# Patient Record
Sex: Female | Born: 1985 | Hispanic: Yes | Marital: Single | Smoking: Never smoker
Health system: Southern US, Community
[De-identification: ages and names within clinical notes are randomized; demographics above are authoritative.]

## PROBLEM LIST (undated history)

## (undated) ENCOUNTER — Inpatient Hospital Stay: Payer: Self-pay

## (undated) DIAGNOSIS — Z789 Other specified health status: Secondary | ICD-10-CM

## (undated) DIAGNOSIS — I10 Essential (primary) hypertension: Secondary | ICD-10-CM

---

## 2005-05-28 ENCOUNTER — Ambulatory Visit: Payer: Self-pay | Admitting: Family Medicine

## 2005-07-11 ENCOUNTER — Ambulatory Visit: Payer: Self-pay | Admitting: Family Medicine

## 2005-08-19 ENCOUNTER — Inpatient Hospital Stay: Payer: Self-pay

## 2005-09-21 ENCOUNTER — Observation Stay: Payer: Self-pay

## 2005-11-15 ENCOUNTER — Inpatient Hospital Stay: Payer: Self-pay | Admitting: Obstetrics and Gynecology

## 2012-10-24 ENCOUNTER — Emergency Department: Payer: Self-pay | Admitting: Emergency Medicine

## 2015-11-18 LAB — HM PAP SMEAR: HM Pap smear: NEGATIVE

## 2016-05-01 LAB — OB RESULTS CONSOLE VARICELLA ZOSTER ANTIBODY, IGG: VARICELLA IGG: IMMUNE

## 2016-05-01 LAB — OB RESULTS CONSOLE RUBELLA ANTIBODY, IGM: RUBELLA: IMMUNE

## 2016-05-01 LAB — OB RESULTS CONSOLE HIV ANTIBODY (ROUTINE TESTING): HIV: NONREACTIVE

## 2016-05-01 LAB — OB RESULTS CONSOLE RPR: RPR: NONREACTIVE

## 2016-05-02 LAB — OB RESULTS CONSOLE HEPATITIS B SURFACE ANTIGEN: Hepatitis B Surface Ag: NEGATIVE

## 2016-05-30 ENCOUNTER — Other Ambulatory Visit: Payer: Self-pay | Admitting: Family Medicine

## 2016-05-30 DIAGNOSIS — Z3482 Encounter for supervision of other normal pregnancy, second trimester: Secondary | ICD-10-CM

## 2016-06-06 ENCOUNTER — Ambulatory Visit
Admission: RE | Admit: 2016-06-06 | Discharge: 2016-06-06 | Disposition: A | Discharge status: Home / Self Care | Payer: Medicaid Other | Source: Ambulatory Visit | Attending: Family Medicine | Admitting: Family Medicine

## 2016-06-06 DIAGNOSIS — Z3A17 17 weeks gestation of pregnancy: Secondary | ICD-10-CM | POA: Insufficient documentation

## 2016-06-06 DIAGNOSIS — Z3482 Encounter for supervision of other normal pregnancy, second trimester: Secondary | ICD-10-CM | POA: Diagnosis not present

## 2016-07-03 ENCOUNTER — Other Ambulatory Visit: Payer: Self-pay | Admitting: Family Medicine

## 2016-07-03 DIAGNOSIS — Z3689 Encounter for other specified antenatal screening: Secondary | ICD-10-CM

## 2016-07-19 ENCOUNTER — Encounter: Payer: Self-pay | Admitting: Radiology

## 2016-07-19 ENCOUNTER — Ambulatory Visit
Admission: RE | Admit: 2016-07-19 | Discharge: 2016-07-19 | Disposition: A | Discharge status: Home / Self Care | Payer: Self-pay | Source: Ambulatory Visit | Attending: Family Medicine | Admitting: Family Medicine

## 2016-07-19 DIAGNOSIS — Z3A24 24 weeks gestation of pregnancy: Secondary | ICD-10-CM | POA: Insufficient documentation

## 2016-07-19 DIAGNOSIS — Z3689 Encounter for other specified antenatal screening: Secondary | ICD-10-CM

## 2016-07-19 HISTORY — DX: Other specified health status: Z78.9

## 2016-10-02 LAB — OB RESULTS CONSOLE GC/CHLAMYDIA
CHLAMYDIA, DNA PROBE: NEGATIVE
Gonorrhea: NEGATIVE

## 2016-10-26 ENCOUNTER — Encounter
Admission: RE | Admit: 2016-10-26 | Discharge: 2016-10-26 | Disposition: A | Discharge status: Home / Self Care | Payer: Self-pay | Source: Ambulatory Visit | Attending: Obstetrics & Gynecology | Admitting: Obstetrics & Gynecology

## 2016-10-26 ENCOUNTER — Inpatient Hospital Stay
Admission: EM | Admit: 2016-10-26 | Discharge: 2016-10-29 | DRG: 766 | Disposition: A | Discharge status: Home / Self Care | Payer: Medicaid Other | Attending: Obstetrics and Gynecology | Admitting: Obstetrics and Gynecology

## 2016-10-26 ENCOUNTER — Observation Stay
Admission: EM | Admit: 2016-10-26 | Discharge: 2016-10-26 | Disposition: A | Discharge status: Home / Self Care | Payer: Self-pay | Attending: Obstetrics and Gynecology | Admitting: Obstetrics and Gynecology

## 2016-10-26 DIAGNOSIS — O26899 Other specified pregnancy related conditions, unspecified trimester: Principal | ICD-10-CM | POA: Insufficient documentation

## 2016-10-26 DIAGNOSIS — Z3A38 38 weeks gestation of pregnancy: Secondary | ICD-10-CM

## 2016-10-26 DIAGNOSIS — Z01812 Encounter for preprocedural laboratory examination: Secondary | ICD-10-CM | POA: Insufficient documentation

## 2016-10-26 DIAGNOSIS — O34211 Maternal care for low transverse scar from previous cesarean delivery: Principal | ICD-10-CM | POA: Diagnosis present

## 2016-10-26 DIAGNOSIS — Z9889 Other specified postprocedural states: Secondary | ICD-10-CM

## 2016-10-26 HISTORY — DX: Essential (primary) hypertension: I10

## 2016-10-26 LAB — TYPE AND SCREEN
ABO/RH(D): B POS
Antibody Screen: NEGATIVE
Extend sample reason: UNDETERMINED

## 2016-10-26 LAB — CBC
HEMATOCRIT: 41.5 % (ref 35.0–47.0)
HEMOGLOBIN: 14 g/dL (ref 12.0–16.0)
MCH: 30.3 pg (ref 26.0–34.0)
MCHC: 33.7 g/dL (ref 32.0–36.0)
MCV: 90.1 fL (ref 80.0–100.0)
Platelets: 135 10*3/uL — ABNORMAL LOW (ref 150–440)
RBC: 4.6 MIL/uL (ref 3.80–5.20)
RDW: 14.5 % (ref 11.5–14.5)
WBC: 9.7 10*3/uL (ref 3.6–11.0)

## 2016-10-26 LAB — BASIC METABOLIC PANEL
Anion gap: 7 (ref 5–15)
BUN: 9 mg/dL (ref 6–20)
CHLORIDE: 105 mmol/L (ref 101–111)
CO2: 26 mmol/L (ref 22–32)
CREATININE: 0.61 mg/dL (ref 0.44–1.00)
Calcium: 9.3 mg/dL (ref 8.9–10.3)
GFR calc Af Amer: >60 mL/min (ref >60)
GFR calc non Af Amer: >60 mL/min (ref >60)
Glucose, Bld: 76 mg/dL (ref 65–99)
POTASSIUM: 4 mmol/L (ref 3.5–5.1)
Sodium: 138 mmol/L (ref 135–145)

## 2016-10-26 LAB — RAPID HIV SCREEN (HIV 1/2 AB+AG)
HIV 1/2 ANTIBODIES: NONREACTIVE
HIV-1 P24 ANTIGEN - HIV24: NONREACTIVE

## 2016-10-26 NOTE — OB Triage Note (Signed)
Patient c/o of possible ROM. Patient was in bed when she felt gush of fluid around 0600am. Reports that she no longer felt LOF. Reports lower abdominal pain since yesterday rating contraction 7 out of 10. Reports positive FM. Denies bleeding. Nitrozen however negative. Last pregnancy resulted in c-section due to preeclampsia per patient.

## 2016-10-26 NOTE — H&P (Signed)
OB History & Physical  Chief Complaint:   Patient ID: Candace Dominguez is a 31 y.o. female who receives care at [facility], presents today to discuss Delivery plans  HPI: Limited records available at time of visit, patient is spanish speaking only with use of interpreter. Had an emergency Cesarean 19yrs ago due to what she says was preeclampsia; was put to sleep. Does not recall any complications. She is otherwise healthy and this pregnancy has had no complications. She is dated by sure LMP on 01/30/16 and confirmed with a 17wk ultrasound.  Her EDD is 11/05/16.  Problems this pregnancy 1. Dilated renal pelvis at 17wks, resolved by 23 weeks 2. History of cesarean  Past Medical History: has no past medical history on file.  Past Surgical History: has a past surgical history that includes Cesarean section. Family History: family history includes Diabetes in her father and maternal aunt. Social History: reports that she has never smoked. She has never used smokeless tobacco. She reports that she does not drink alcohol or use drugs. OB/GYN History:  OB History  Gravida Para Term Preterm AB Living  SAB TAB Ectopic Molar Multiple Live Births  2 1    Allergies: has No Known Allergies. Medications:  Current Outpatient Prescriptions:  . aspirin 81 MG chewable tablet, Take by mouth., Disp: , Rfl:  . prenatal vit-sel-iron fum-FA 27-1 mg Tab, Take by mouth., Disp: , Rfl:   Review of Systems: No SOB, no palpitations or chest pain, no new lower extremity edema, no nausea or vomiting or bowel or bladder complaints. See HPI for gyn specific ROS.  Exam:    BP 119/80  Pulse 96  Ht 154.9 cm ( )  Wt 76.6 kg (168 lb 12.8 oz)  BMI 31.89 kg/m   General: Patient is well-groomed, well-nourished, appears stated age in no acute distress  HEENT: head is atraumatic and normocephalic, trachea is midline, neck is supple with no palpable nodules  CV:  Regular rhythm and normal heart rate, no murmur  Pulm: Clear to auscultation throughout lung fields with no wheezing, crackles, or rhonchi. No increased work of breathing  Abdomen: soft , no mass, non-tender, no rebound tenderness, no hepatomegaly, well-healed transverse pfannenstiel scar  Pelvic: deferred  Impression:   The encounter diagnosis was Preoperative exam for gynecologic surgery.  Plan:   Will plan for repeat cesarean at 39wks, or sooner if labor presents. ON books for 10/29/16 - notified labor and delivery, as well as OR Will need to return to sign consents and for preop testing.  ----- Ranae Plumber, MD Attending Obstetrician and Gynecologist [facility], Department of OB/GYN [facility]

## 2016-10-26 NOTE — Final Progress Note (Signed)
NST  Baseline:  Variability: moderate Accelerations present x >2 Decelerations absent Time  Interpretation: reactive NST, category 1 tracing  ----- Christeen Douglas, MD MPH Attending Obstetrician and Gynecologist [facility], Department of OB/GYN [facility]  NST  Baseline: 140 Variability: moderate Accelerations present x >2 Decelerations absent Time  Interpretation: reactive NST, category 1 tracing  ----- Christeen Douglas, MD MPH Attending Obstetrician and Gynecologist [facility], Department of OB/GYN [facility]

## 2016-10-26 NOTE — OB Triage Note (Signed)
Patient c/o of LOF since this morning at 6am. However nitrozen was negative this morning and patient was discharged home. Since discharge patient c/o of leakage, however around 2230 patient felt another gush of fluid of brown color. Patient also c/o of contractions every 6 mins apart rating pain 10 out of 10 when contractions. Denies vaginal bleeding. LOF per patient is a brown tinge color. Denies sexual intercourse in the last 24hrs.

## 2016-10-26 NOTE — Patient Instructions (Signed)
Your procedure is scheduled on: @ Septembre 17, 2018 Lunes Report to departmente emegencia a     Remember: Instructions that are not followed completely may result in serious medical risk, up to and including death, or upon the discretion of your surgeon and anesthesiologist your surgery may need to be rescheduled.  Recuerde: Las instrucciones que no se siguen completamente Armed forces logistics/support/administrative officer en un riesgo de salud grave, incluyendo hasta la [place] o a discrecin de su cirujano y Scientific laboratory technician, su ciruga se puede posponer.   __x__ 1. Do not eat food or drink liquids after midnight. No gum chewing or hard candies.  No coma alimentos ni tome lquidos despus de la medianoche.  No mastique chicle ni caramelos  duros.     __x__ 2. No alcohol for 24 hours before or after surgery.    No tome alcohol durante las 24 horas antes ni despus de la [place].     __x__ 3 Notify your doctor if there is any change in your medical condition (cold, fever,                             infections).    Informe a su mdico si hay algn cambio en su condicin mdica (resfriado, fiebre, infecciones).   Do not wear jewelry, make-up, hairpins, clips or nail polish.  No use joyas, maquillajes, pinzas/ganchos para el cabello ni esmalte de uas.  Do not wear lotions, powders, or perfumes. You may wear deodorant.  No use lociones, polvos o perfumes.  Puede usar desodorante.    Do not shave 48 hours prior to surgery. Men may shave face and neck.  No se afeite 48 horas antes de la [place].  Los hombres pueden [employer] cara y el cuello.   Do not bring valuables to the hospital.   No lleve objetos de valor al hospital.  [facility] is not responsible for any belongings or valuables.  [place] no se hace responsable de ningn tipo de pertenencias u objetos de Licensed conveyancer.                Deje su maleta en el auto.  Despus de la ciruga podr traerla a su habitacin.  For patients admitted to the hospital,  discharge time is determined by your treatment team.  Para los pacientes que sean ingresados al hospital, el tiempo en el cual se le dar de alta es determinado por su                equipo de [place].   Patients discharged the day of surgery will not be allowed to drive home. A los pacientes que se les da de alta el mismo da de la ciruga no se les permitir conducir a Higher education careers adviser.   Please read over the following fact sheets that you were given: Por favor lea las siguientes hojas de informacin que le dieron:      ____ Take these medicines the morning of surgery with A SIP OF WATER:          [employer] estas medicinas la maana de la ciruga con UN SORBO DE AGUA:  1. nada  2.   3.   4.       5.  6.     __x__ Use CHG Soap as directed          Utilice el jabn de CHG segn lo indicado

## 2016-10-27 ENCOUNTER — Inpatient Hospital Stay: Payer: Medicaid Other | Admitting: Anesthesiology

## 2016-10-27 ENCOUNTER — Encounter
Admission: EM | Disposition: A | Discharge status: Home / Self Care | Payer: Self-pay | Source: Home / Self Care | Attending: Obstetrics and Gynecology

## 2016-10-27 DIAGNOSIS — Z3A38 38 weeks gestation of pregnancy: Secondary | ICD-10-CM | POA: Diagnosis not present

## 2016-10-27 DIAGNOSIS — O34211 Maternal care for low transverse scar from previous cesarean delivery: Secondary | ICD-10-CM | POA: Diagnosis present

## 2016-10-27 DIAGNOSIS — Z9889 Other specified postprocedural states: Secondary | ICD-10-CM

## 2016-10-27 LAB — CBC
HCT: 38.3 % (ref 35.0–47.0)
Hemoglobin: 13 g/dL (ref 12.0–16.0)
MCH: 30.3 pg (ref 26.0–34.0)
MCHC: 34 g/dL (ref 32.0–36.0)
MCV: 89.1 fL (ref 80.0–100.0)
PLATELETS: 122 10*3/uL — AB (ref 150–440)
RBC: 4.3 MIL/uL (ref 3.80–5.20)
RDW: 14 % (ref 11.5–14.5)
WBC: 13.3 10*3/uL — ABNORMAL HIGH (ref 3.6–11.0)

## 2016-10-27 LAB — TYPE AND SCREEN
ABO/RH(D): B POS
Antibody Screen: NEGATIVE

## 2016-10-27 LAB — RPR: RPR: NONREACTIVE

## 2016-10-27 SURGERY — Surgical Case
Anesthesia: Spinal | Wound class: Clean Contaminated

## 2016-10-27 MED ORDER — WITCH HAZEL-GLYCERIN EX PADS: 1.0000 "application " | MEDICATED_PAD | CUTANEOUS | Status: DC | PRN

## 2016-10-27 MED ORDER — MENTHOL 3 MG MT LOZG
1.0000 | LOZENGE | OROMUCOSAL | Status: DC | PRN
  Filled 2016-10-27: qty 9

## 2016-10-27 MED ORDER — TETANUS-DIPHTH-ACELL PERTUSSIS 5-2.5-18.5 LF-MCG/0.5 IM SUSP: 0.5000 mL | Freq: Once | INTRAMUSCULAR | Status: DC

## 2016-10-27 MED ORDER — BUPIVACAINE LIPOSOME 1.3 % IJ SUSP
20.0000 mL | Freq: Once | INTRAMUSCULAR | Status: DC
  Filled 2016-10-27: qty 20

## 2016-10-27 MED ORDER — CEFAZOLIN SODIUM-DEXTROSE 2-4 GM/100ML-% IV SOLN
2.0000 g | INTRAVENOUS | Status: AC
  Administered 2016-10-27: 2 g via INTRAVENOUS
  Filled 2016-10-27: qty 100

## 2016-10-27 MED ORDER — NALBUPHINE HCL 10 MG/ML IJ SOLN: 5.0000 mg | INTRAMUSCULAR | Status: DC | PRN

## 2016-10-27 MED ORDER — SODIUM CHLORIDE 0.9 % IJ SOLN
INTRAMUSCULAR | Status: AC
  Filled 2016-10-27: qty 50

## 2016-10-27 MED ORDER — PHENYLEPHRINE HCL 10 MG/ML IJ SOLN
INTRAMUSCULAR | Status: DC | PRN
  Administered 2016-10-27: 100 ug via INTRAVENOUS

## 2016-10-27 MED ORDER — NALOXONE HCL 0.4 MG/ML IJ SOLN: 0.4000 mg | INTRAMUSCULAR | Status: DC | PRN

## 2016-10-27 MED ORDER — MEPERIDINE HCL 50 MG/ML IJ SOLN: 6.2500 mg | INTRAMUSCULAR | Status: DC | PRN

## 2016-10-27 MED ORDER — DIPHENHYDRAMINE HCL 25 MG PO CAPS: 25.0000 mg | ORAL_CAPSULE | ORAL | Status: DC | PRN

## 2016-10-27 MED ORDER — OXYTOCIN 10 UNIT/ML IJ SOLN
INTRAMUSCULAR | Status: AC
  Filled 2016-10-27: qty 2

## 2016-10-27 MED ORDER — OXYTOCIN 40 UNITS IN LACTATED RINGERS INFUSION - SIMPLE MED
INTRAVENOUS | Status: DC | PRN
  Administered 2016-10-27: 1000 mL via INTRAVENOUS

## 2016-10-27 MED ORDER — ACETAMINOPHEN 325 MG PO TABS: 650.0000 mg | ORAL_TABLET | ORAL | Status: DC | PRN

## 2016-10-27 MED ORDER — SENNOSIDES-DOCUSATE SODIUM 8.6-50 MG PO TABS
2.0000 | ORAL_TABLET | ORAL | Status: DC
  Administered 2016-10-28: 2 via ORAL
  Filled 2016-10-27: qty 2

## 2016-10-27 MED ORDER — SIMETHICONE 80 MG PO CHEW
80.0000 mg | CHEWABLE_TABLET | Freq: Three times a day (TID) | ORAL | Status: DC
  Administered 2016-10-27 – 2016-10-29 (×6): 80 mg via ORAL
  Filled 2016-10-27 (×6): qty 1

## 2016-10-27 MED ORDER — OXYTOCIN 40 UNITS IN LACTATED RINGERS INFUSION - SIMPLE MED
2.5000 [IU]/h | INTRAVENOUS | Status: AC
  Administered 2016-10-27: 2.5 [IU]/h via INTRAVENOUS
  Filled 2016-10-27 (×2): qty 1000

## 2016-10-27 MED ORDER — ZOLPIDEM TARTRATE 5 MG PO TABS: 5.0000 mg | ORAL_TABLET | Freq: Every evening | ORAL | Status: DC | PRN

## 2016-10-27 MED ORDER — OXYCODONE HCL 5 MG PO TABS: 5.0000 mg | ORAL_TABLET | ORAL | Status: DC | PRN

## 2016-10-27 MED ORDER — LACTATED RINGERS IV BOLUS (SEPSIS)
1000.0000 mL | Freq: Once | INTRAVENOUS | Status: AC
  Administered 2016-10-27: 1000 mL via INTRAVENOUS

## 2016-10-27 MED ORDER — SIMETHICONE 80 MG PO CHEW: 80.0000 mg | CHEWABLE_TABLET | ORAL | Status: DC | PRN

## 2016-10-27 MED ORDER — LACTATED RINGERS IV SOLN
INTRAVENOUS | Status: DC
  Administered 2016-10-28: 07:00:00 via INTRAVENOUS

## 2016-10-27 MED ORDER — MORPHINE SULFATE (PF) 0.5 MG/ML IJ SOLN
INTRAMUSCULAR | Status: DC | PRN
  Administered 2016-10-27: .2 mg via EPIDURAL

## 2016-10-27 MED ORDER — OXYCODONE HCL 5 MG PO TABS: 10.0000 mg | ORAL_TABLET | ORAL | Status: DC | PRN

## 2016-10-27 MED ORDER — ONDANSETRON HCL 4 MG/2ML IJ SOLN
INTRAMUSCULAR | Status: DC | PRN
  Administered 2016-10-27: 4 mg via INTRAVENOUS

## 2016-10-27 MED ORDER — COCONUT OIL OIL: 1.0000 "application " | TOPICAL_OIL | Status: DC | PRN

## 2016-10-27 MED ORDER — SODIUM CHLORIDE 0.9% FLUSH: 3.0000 mL | INTRAVENOUS | Status: DC | PRN

## 2016-10-27 MED ORDER — KETOROLAC TROMETHAMINE 30 MG/ML IJ SOLN: 30.0000 mg | Freq: Four times a day (QID) | INTRAMUSCULAR | Status: AC | PRN

## 2016-10-27 MED ORDER — SODIUM CHLORIDE 0.9 % IV SOLN
INTRAVENOUS | Status: DC | PRN
  Administered 2016-10-27: 50 ug/min via INTRAVENOUS

## 2016-10-27 MED ORDER — DIBUCAINE 1 % RE OINT: 1.0000 "application " | TOPICAL_OINTMENT | RECTAL | Status: DC | PRN

## 2016-10-27 MED ORDER — IBUPROFEN 600 MG PO TABS
600.0000 mg | ORAL_TABLET | Freq: Four times a day (QID) | ORAL | Status: DC
  Administered 2016-10-28 – 2016-10-29 (×7): 600 mg via ORAL
  Filled 2016-10-27 (×8): qty 1

## 2016-10-27 MED ORDER — DIPHENHYDRAMINE HCL 50 MG/ML IJ SOLN: 12.5000 mg | INTRAMUSCULAR | Status: DC | PRN

## 2016-10-27 MED ORDER — ONDANSETRON HCL 4 MG/2ML IJ SOLN: 4.0000 mg | Freq: Three times a day (TID) | INTRAMUSCULAR | Status: DC | PRN

## 2016-10-27 MED ORDER — SOD CITRATE-CITRIC ACID 500-334 MG/5ML PO SOLN
30.0000 mL | ORAL | Status: AC
  Administered 2016-10-27: 30 mL via ORAL
  Filled 2016-10-27: qty 15

## 2016-10-27 MED ORDER — SIMETHICONE 80 MG PO CHEW
80.0000 mg | CHEWABLE_TABLET | ORAL | Status: DC
  Administered 2016-10-28 (×2): 80 mg via ORAL
  Filled 2016-10-27 (×2): qty 1

## 2016-10-27 MED ORDER — SOD CITRATE-CITRIC ACID 500-334 MG/5ML PO SOLN: 30.0000 mL | Freq: Once | ORAL | Status: DC

## 2016-10-27 MED ORDER — ACETAMINOPHEN 500 MG PO TABS
1000.0000 mg | ORAL_TABLET | Freq: Four times a day (QID) | ORAL | Status: AC
  Administered 2016-10-28: 1000 mg via ORAL
  Filled 2016-10-27 (×2): qty 2

## 2016-10-27 MED ORDER — MORPHINE SULFATE (PF) 0.5 MG/ML IJ SOLN
INTRAMUSCULAR | Status: AC
  Filled 2016-10-27: qty 10

## 2016-10-27 MED ORDER — ACETAMINOPHEN 650 MG RE SUPP
650.0000 mg | Freq: Once | RECTAL | Status: DC
  Filled 2016-10-27: qty 1

## 2016-10-27 MED ORDER — AMMONIA AROMATIC IN INHA
RESPIRATORY_TRACT | Status: AC
  Filled 2016-10-27: qty 10

## 2016-10-27 MED ORDER — BUPIVACAINE HCL (PF) 0.5 % IJ SOLN
INTRAMUSCULAR | Status: DC | PRN
  Administered 2016-10-27: 27 mL

## 2016-10-27 MED ORDER — MISOPROSTOL 200 MCG PO TABS
ORAL_TABLET | ORAL | Status: AC
  Filled 2016-10-27: qty 4

## 2016-10-27 MED ORDER — BUPIVACAINE IN DEXTROSE 0.75-8.25 % IT SOLN
INTRATHECAL | Status: DC | PRN
Start: 2016-10-27 — End: 2016-10-27
  Administered 2016-10-27: 1.6 mL via INTRATHECAL

## 2016-10-27 MED ORDER — LACTATED RINGERS IV SOLN
INTRAVENOUS | Status: DC
  Administered 2016-10-27: 03:00:00 via INTRAVENOUS

## 2016-10-27 MED ORDER — SODIUM CHLORIDE 0.9 % IV SOLN
INTRAVENOUS | Status: DC | PRN
  Administered 2016-10-27: 63 mL

## 2016-10-27 MED ORDER — BUPIVACAINE HCL (PF) 0.5 % IJ SOLN
30.0000 mL | Freq: Once | INTRAMUSCULAR | Status: DC
  Filled 2016-10-27: qty 30

## 2016-10-27 MED ORDER — LIDOCAINE HCL (PF) 1 % IJ SOLN
INTRAMUSCULAR | Status: AC
  Filled 2016-10-27: qty 30

## 2016-10-27 MED ORDER — PRENATAL MULTIVITAMIN CH
1.0000 | ORAL_TABLET | Freq: Every day | ORAL | Status: DC
  Administered 2016-10-27 – 2016-10-29 (×3): 1 via ORAL
  Filled 2016-10-27 (×3): qty 1

## 2016-10-27 MED ORDER — DIPHENHYDRAMINE HCL 25 MG PO CAPS: 25.0000 mg | ORAL_CAPSULE | Freq: Four times a day (QID) | ORAL | Status: DC | PRN

## 2016-10-27 MED ORDER — OXYTOCIN 40 UNITS IN LACTATED RINGERS INFUSION - SIMPLE MED
INTRAVENOUS | Status: AC
  Filled 2016-10-27: qty 1000

## 2016-10-27 SURGICAL SUPPLY — 25 items
BARRIER ADHS 3X4 INTERCEED (GAUZE/BANDAGES/DRESSINGS) ×6 IMPLANT
CANISTER SUCT 3000ML PPV (MISCELLANEOUS) ×3 IMPLANT
CHLORAPREP W/TINT 26ML (MISCELLANEOUS) ×3 IMPLANT
DRSG TELFA 3X8 NADH (GAUZE/BANDAGES/DRESSINGS) ×3 IMPLANT
ELECT CAUTERY BLADE 6.4 (BLADE) ×3 IMPLANT
ELECT REM PT RETURN 9FT ADLT (ELECTROSURGICAL) ×3
ELECTRODE REM PT RTRN 9FT ADLT (ELECTROSURGICAL) ×1 IMPLANT
GAUZE SPONGE 4X4 12PLY STRL (GAUZE/BANDAGES/DRESSINGS) ×3 IMPLANT
GLOVE BIO SURGEON STRL SZ8 (GLOVE) ×24 IMPLANT
GOWN STRL REUS W/ TWL LRG LVL3 (GOWN DISPOSABLE) ×2 IMPLANT
GOWN STRL REUS W/ TWL XL LVL3 (GOWN DISPOSABLE) ×2 IMPLANT
GOWN STRL REUS W/TWL LRG LVL3 (GOWN DISPOSABLE) ×4
GOWN STRL REUS W/TWL XL LVL3 (GOWN DISPOSABLE) ×4
NDL SAFETY 22GX1.5 (NEEDLE) ×3 IMPLANT
NS IRRIG 1000ML POUR BTL (IV SOLUTION) ×3 IMPLANT
PACK C SECTION AR (MISCELLANEOUS) ×3 IMPLANT
PAD OB MATERNITY 4.3X12.25 (PERSONAL CARE ITEMS) ×3 IMPLANT
PAD PREP 24X41 OB/GYN DISP (PERSONAL CARE ITEMS) ×3 IMPLANT
SPONGE LAP 18X18 5 PK (GAUZE/BANDAGES/DRESSINGS) ×3 IMPLANT
STAPLER INSORB 30 2030 C-SECTI (MISCELLANEOUS) ×3 IMPLANT
STRAP SAFETY BODY (MISCELLANEOUS) ×3 IMPLANT
SUT CHROMIC 1 CTX 36 (SUTURE) ×9 IMPLANT
SUT PLAIN GUT 0 (SUTURE) IMPLANT
SUT VIC AB 0 CT1 36 (SUTURE) ×6 IMPLANT
SYR 30ML LL (SYRINGE) ×6 IMPLANT

## 2016-10-27 NOTE — Op Note (Deleted)
  The note originally documented on this encounter has been moved the the encounter in which it belongs.  

## 2016-10-27 NOTE — Brief Op Note (Signed)
DOS  10/27/2016   4:29 AM  PATIENT:  Candace Dominguez  31 y.o. female  PRE-OPERATIVE DIAGNOSIS:  Active labor , elective repeat LTCS   POST-OPERATIVE DIAGNOSIS:  Repeat Cesarean Section  PROCEDURE:  Procedure(s): CESAREAN SECTION (N/A) LCTS SURGEON:  Surgeon(s) and Role:    * Harlea Goetzinger, Ihor Austin, MD - Primary  PHYSICIAN ASSISTANT: scrub tech   ASSISTANTS:    ANESTHESIA:   spinal  EBL:  Total I/O In: 500 [I.V.:500] Out: 600 [Blood:600]  BLOOD ADMINISTERED:none  DRAINS: Urinary Catheter (Foley)   LOCAL MEDICATIONS USED:  MARCAINE    and BUPIVICAINE   SPECIMEN:  No Specimen  DISPOSITION OF SPECIMEN:  N/A  COUNTS:  YES  TOURNIQUET:  * No tourniquets in log *  DICTATION: .Other Dictation: Dictation Number verbal  PLAN OF CARE: Admit to inpatient   PATIENT DISPOSITION:  PACU - hemodynamically stable.   Delay start of Pharmacological VTE agent (>24hrs) due to surgical blood loss or risk of bleeding: not applicable

## 2016-10-27 NOTE — Anesthesia Post-op Follow-up Note (Signed)
Anesthesia QCDR form completed.        

## 2016-10-27 NOTE — Anesthesia Preprocedure Evaluation (Signed)
Anesthesia Evaluation  Patient identified by MRN, date of birth, ID band Patient awake    Reviewed: Allergy & Precautions, NPO status , Patient's Chart, lab work & pertinent test results, reviewed documented beta blocker date and time   Airway Mallampati: II  TM Distance: >3 FB     Dental  (+) Chipped   Pulmonary           Cardiovascular hypertension, Pt. on medications      Neuro/Psych    GI/Hepatic   Endo/Other    Renal/GU      Musculoskeletal   Abdominal   Peds  Hematology   Anesthesia Other Findings   Reproductive/Obstetrics                             Anesthesia Physical Anesthesia Plan  ASA: II  Anesthesia Plan: Spinal   Post-op Pain Management:    Induction:   PONV Risk Score and Plan:   Airway Management Planned:   Additional Equipment:   Intra-op Plan:   Post-operative Plan:   Informed Consent: I have reviewed the patients History and Physical, chart, labs and discussed the procedure including the risks, benefits and alternatives for the proposed anesthesia with the patient or authorized representative who has indicated his/her understanding and acceptance.     Plan Discussed with: CRNA  Anesthesia Plan Comments:         Anesthesia Quick Evaluation

## 2016-10-27 NOTE — Progress Notes (Signed)
Patient ID: Candace Dominguez, female   DOB: 01/30/1986, 31 y.o.   MRN: [id number] Pt presents tonight with LOF and CTX . Pt is changing cervix and is 5.5 cm dilated . Scheduled for a c/s on Monday.  Will proceed today . I have counseled the pt via translator ( nursing) and all questions have been answered . Pt declines BTL .

## 2016-10-27 NOTE — Anesthesia Procedure Notes (Signed)
Spinal  Patient location during procedure: OR Staffing Anesthesiologist: Julita Ozbun Performed: anesthesiologist  Preanesthetic Checklist Completed: patient identified, site marked, surgical consent, pre-op evaluation, timeout performed, IV checked and risks and benefits discussed Spinal Block Patient position: sitting Prep: Betadine Patient monitoring: heart rate, cardiac monitor, continuous pulse ox and blood pressure Approach: midline Location: L3-4 Injection technique: single-shot Needle Needle type: Pencil-Tip  Needle gauge: 25 G Needle length: 9 cm Assessment Sensory level: T10     

## 2016-10-27 NOTE — Transfer of Care (Signed)
Immediate Anesthesia Transfer of Care Note  Patient: Candace Dominguez  Procedure(s) Performed: Procedure(s): CESAREAN SECTION (N/A)  Patient Location: PACU  Anesthesia Type:Spinal  Level of Consciousness: awake, alert  and oriented  Airway & Oxygen Therapy: Patient Spontanous Breathing  Post-op Assessment: Post -op Vital signs reviewed and stable  Post vital signs: stable  Last Vitals:  Vitals:   10/27/16 0248 10/27/16 0436  BP: 118/87 (!) 92/51  Pulse: 83 92  Resp: 18 13  Temp: 36.4 C   SpO2:  99%    Last Pain:  Vitals:   10/27/16 0248  TempSrc: Oral  PainSc:          Complications: No apparent anesthesia complications

## 2016-10-27 NOTE — Lactation Note (Signed)
This note was copied from a baby's chart. Went in to assist mom with breast feeding.  Baby was skin to skin with mom and mom was attempting to latch infant to breast.  Dusky color noted.  Repositioned her and she began vigorous crying and pinked up immediately.  Took her to crib to change meconium stool and observe more closely.  Baby was making snorting sound, but no grunting or retracting noted.  She is large for gestational age, weighing 4050 grams.  She has large cheeks with small mouth and nares.  Placed her back to the breast in cradle hold skin to skin with mom. At first she kept sucking in lower lip.  Once we achieved deep enough latch, she began good rhythmic sucking with an occasional swallow.  She continued sucking at the breast, but was not hearing any nasal breathing even though LC was pushing slightly inward toward her mouth to allow room for nose to get more air.  After a few more sucks, she started to look dusky again.  Immediately broke suction and removed from the breast.  Mucous membranes were cyanotic.  Upon aggressive stimulation, her color returned and just remained a little red.  She does have some bruising from delivery, but otherwise turned pink right away.  Called Dr. Chelsea Primus into the patient's room.  Dr. Chelsea Primus assessed that other than ruddy color, she was no longer cyanotic with good profusion.  We put her back to the breast where she latched and she slowly began strong rhythmic sucking.  Less than 10 minutes into the feeding while she was still latched to the breast, she seemed to start struggling to breath through her nose with snorting sound again with right nare closing and slowly started  to look a little dusky again.  Took her off the breast.  As soon as she could breath and cry through her mouth, she pinked up again.  Dr. Chelsea Primus notified.  She was transferred to the nursery with Dr. Chelsea Primus present where an NG tube was inserted through each nostril.  It passed easily through left  nostril.  After several attempts, J.Mayford Knife, RN was able to pass through right nostril. Oxygen Sats were normal on right hand and left foot rising gradually from 83% to 100%.  Bottle of formula was given while continuing to monitor Oxygen Sats with normal readings. She took 5 ml, stopped for short period and then took another 7 ml without difficulty.  Dr. Chelsea Primus prefers bottlefeeding for now, but can put back to breast with nipple shield with one on one monitoring for next feeding to see how she tolerates.  Baby returned to room with mom with instructions of when and how to call with concerns given to mom through Spanish interpreter by Dr. Chelsea Primus and nurse Shirlean Kelly.  [facility] set up in room with instructions in collection, handling, labeling and storage of milk.

## 2016-10-27 NOTE — Op Note (Signed)
NAMEEMMERSON, SHUFFIELD            ACCOUNT NO.:  [id number]  MEDICAL RECORD NO.:  [id number]  LOCATION:                                 FACILITY:  PHYSICIAN:  Jennell Corner, MDDATE OF BIRTH:  Dec 16, 1985  DATE OF PROCEDURE:  10/27/2016 DATE OF DISCHARGE:  10/29/2016                              OPERATIVE REPORT   PREOPERATIVE DIAGNOSIS: 1. 38 plus 5 weeks' estimated gestational age. 2. Active labor. 3. Previous cesarean section.  POSTOPERATIVE DIAGNOSIS: 1. 38 plus 5 weeks' estimated gestational age. 2. Active labor. 3. Previous cesarean section.  PROCEDURE PERFORMED:  Repeat low transverse cesarean section.  SURGEON:  Jennell Corner, MD  ANESTHESIA:  Spinal.  FIRST ASSISTANT:  Scrub tech.  INDICATION:  A 31 year old gravida 4, para 2 patient with previous low- transverse cesarean section and was scheduled for elective repeat cesarean section on October 29, 2016 when she was admitted to Labor and Delivery with active labor and cervical change.  DESCRIPTION OF PROCEDURE:  After adequate spinal anesthesia, the patient was placed in dorsal supine position with hip rolled onto the right side.  The patient was prepped and draped in normal sterile fashion.  2 g of IV Ancef was administered previous to commencement of the case.  A time-out was performed.  A Pfannenstiel incision was made 2 fingerbreadths above the symphysis pubis.  Sharp dissection was used to identify the fascia.  The fascia was opened in the midline and opened in a transverse fashion.  The superior aspect of the fascia was grasped with Kocher clamps, and the recti muscles were dissected free.  Inferior aspect of the fascia was grasped with Kocher clamps, and the pyramidalis muscle was dissected free.  Entry into the peritoneal cavity was accomplished sharply.  The vesicouterine peritoneal fold was identified, and bladder flap was created.  The bladder was reflected inferiorly.  A low transverse  uterine incision was made upon entry into the endometrial cavity.  Meconium-stained fluid was noted.  The fetal head was delivered through the incision without difficulty, and the body was delivered. Large female was dried on the patient's abdomen.  A vigorous female, Apgars were assigned 7 and 9.  Time of birth is 0355 hours on October 27, 2016.  Fetal weight 8 pounds 15 ounces.  After 60 seconds, cord was doubly clamped, and infant was passed to the nursery staff.  The placenta was then manually delivered.  The uterus was exteriorized. Endometrial cavity was wiped clean with laparotomy tape, and the cervix was opened with a ring forceps, and this was passed off the operative field.  Uterine incision was closed with 1 chromic suture in a running locking fashion, good approximation of edges, good hemostasis was noted. Fallopian tubes and ovaries appeared normal.  Posterior cul-de-sac was irrigated and suctioned, and the uterus was placed back into the abdominal cavity.  The pericolic gutters were wiped clean with laparotomy tape bilaterally.  Uterine incision again appeared hemostatic.  Interceed was placed over the uterine incision in a T- shaped fashion.  The fascia was closed with 0 Vicryl suture in a running nonlocking fashion.  Good approximation of the edges.  Fascial edge was then injected with a solution of 20  mL of 1.3% Exparel with 30 mL of 0.5% Marcaine and 50 mL normal saline, 60 mL of this solution was injected in the fascial edge.  The subcutaneous tissues were irrigated and Bovie for hemostasis, and the skin was reapproximated with Insorb absorbable staples, and the skin incision was then injected with additional 30 mL of the Exparel solution.  There were no complications. The patient tolerated the procedure well.  ESTIMATED BLOOD LOSS:  600 mL.  INTRAOPERATIVE FLUIDS:  700 mL.  The patient tolerated the procedure well and was taken to recovery room in good  condition.    ______________________________ Jennell Corner, MD   ______________________________ Jennell Corner, MD    TS/MEDQ  D:  10/27/2016  T:  10/27/2016  Job:  [id number]

## 2016-10-27 NOTE — Discharge Summary (Signed)
Obstetric Discharge Summary   Patient ID: Patient Name: AIYA KEACH DOB: 11/28/1985 MRN: [id number]  Date of Admission: 10/26/2016 Date of Discharge: 10/29/16 Primary OB:  Phineas Real  Gestational Age at Delivery: [redacted]w[redacted]d   Antepartum complications: none, prior c/s  Admitting Diagnosis: active labor , previous c/s   Secondary Diagnoses: Patient Active Problem List   Diagnosis Date Noted  . Postoperative state 10/27/2016    Augmentation:  Complications: None Intrapartum complications/course:   Date of Delivery:10/27/16 Delivered By: Ihor Austin Schermerhorn MD Delivery Type: repeat cesarean section, low transverse incision Anesthesia: epidural Placenta: sponatneous Laceration:  Episiotomy: none  Newborn Data: Live born female  Birth Weight: 8 lb 14.9 oz (4050 g) APGAR: 7, 9      Postpartum Course  Patient had an uncomplicated postpartum course.  By time of discharge on POD#2, her pain was controlled on oral pain medications; she had appropriate lochia and was ambulating, voiding without difficulty and tolerating regular diet.  She was deemed stable for discharge to home.     Labs: CBC Latest Ref Rng & Units 10/27/2016 10/26/2016  WBC 3.6 - 11.0 K/uL 13.3(H) 9.7  Hemoglobin 12.0 - 16.0 g/dL 78.4 69.6  Hematocrit 29.5 - 47.0 % 38.3 41.5  Platelets 150 - 440 K/uL 122(L) 135(L)   B POS  Physical exam:  BP 113/67 (BP Location: Right Arm)   Pulse 97   Temp 98.2 F (36.8 C) (Oral)   Resp 16   Ht  (1.549 m)   Wt 77.6 kg (171 lb)   LMP 01/30/2016   SpO2 100%   BMI 32.31 kg/m  General: alert and no distress Pulm: normal respiratory effort Lochia: appropriate Abdomen: soft, NT Uterine Fundus: firm, below umbilicus Incision: c/d/i, healing well, no significant drainage, no dehiscence, no significant erythema Extremities: No evidence of DVT seen on physical exam. No lower extremity edema.   Disposition: stable, discharge to home Baby Feeding:  breastmilk&formula Baby Disposition: home with mom  Contraception: To be decided at 6 weeks pp  Prenatal Labs:  BT B+  Rubella IMM Var IMM  Elevated 1 hr GTT , nl 3 hr  Negative GBS   Plan:  Antony Salmon Pena-Gomez was discharged to home in good condition. Follow-up appointment at [facility] in 2 weeks  Discharge Instructions: Per After Visit Summary. Activity: Advance as tolerated. Pelvic rest for 6 weeks.  Refer to After Visit Summary Diet: Regular Discharge Medications:PNV, Fe, Rx for Percocet  Outpatient follow up: 2 weeks at [facility]   Signed:  Sharee Pimple, RN, MSN, CNM, FNP

## 2016-10-28 ENCOUNTER — Encounter: Payer: Self-pay | Admitting: Obstetrics and Gynecology

## 2016-10-28 NOTE — Plan of Care (Signed)
Problem: Pain Management: Goal: General experience of comfort will improve and pain level will decrease Outcome: Progressing States pain relief with scheduled Motrin via [facility].

## 2016-10-28 NOTE — Anesthesia Postprocedure Evaluation (Signed)
Anesthesia Post Note  Patient: Antony Salmon Pena-Gomez  Procedure(s) Performed: Procedure(s) (LRB): CESAREAN SECTION (N/A)  Patient location during evaluation: Mother Baby Anesthesia Type: Spinal Level of consciousness: awake and alert Pain management: pain level controlled Vital Signs Assessment: post-procedure vital signs reviewed and stable Respiratory status: spontaneous breathing, nonlabored ventilation and respiratory function stable Cardiovascular status: stable Postop Assessment: no headache, no backache and patient able to bend at knees Anesthetic complications: no     Last Vitals:  Vitals:   10/27/16 2355 10/28/16 0357  BP: (!) 103/52 (!) 91/48  Pulse: (!) 123 93  Resp: 20 20  Temp: 37.4 C 37 C  SpO2: 97% 99%    Last Pain:  Vitals:   10/28/16 0719  TempSrc:   PainSc: 0-No pain                 Cleda Mccreedy Krue Peterka

## 2016-10-28 NOTE — Progress Notes (Signed)
Subjective: Postpartum Day 1: Cesarean Delivery Patient reports no problems voiding.    Objective: Vital signs in last 24 hours: Temp:  [97.7 F (36.5 C)-99.3 F (37.4 C)] 98.3 F (36.8 C) (09/16 0814) Pulse Rate:  [90-123] 91 (09/16 0814) Resp:  [16-20] 17 (09/16 0814) BP: (91-113)/(48-74) 93/61 (09/16 0814) SpO2:  [97 %-100 %] 97 % (09/16 0814)  Physical Exam:  General: alert and cooperative Lochia: appropriate Uterine Fundus: firm Incision: healing well DVT Evaluation: No evidence of DVT seen on physical exam.   Recent Labs  10/26/16 0900 10/27/16 0806  HGB 14.0 13.0  HCT 41.5 38.3    Assessment/Plan: Status post Cesarean section. Doing well postoperatively.  Continue current care.  Ihor Austin Betzalel Umbarger 10/28/2016, 8:25 AM

## 2016-10-28 NOTE — Anesthesia Post-op Follow-up Note (Signed)
  Anesthesia Pain Follow-up Note  Patient: Candace Dominguez  Day #: 1  Date of Follow-up: 10/28/2016 Time: 7:34 AM  Last Vitals:  Vitals:   10/27/16 2355 10/28/16 0357  BP: (!) 103/52 (!) 91/48  Pulse: (!) 123 93  Resp: 20 20  Temp: 37.4 C 37 C  SpO2: 97% 99%    Level of Consciousness: alert  Pain: moderate   Side Effects:None  Catheter Site Exam:clean, dry     Plan: D/C from anesthesia care at surgeon's request  Candace Dominguez

## 2016-10-29 ENCOUNTER — Inpatient Hospital Stay: Admission: RE | Admit: 2016-10-29 | Payer: Self-pay | Source: Ambulatory Visit | Admitting: Obstetrics & Gynecology

## 2016-10-29 ENCOUNTER — Encounter: Admission: RE | Payer: Self-pay | Source: Ambulatory Visit

## 2016-10-29 SURGERY — Surgical Case
Anesthesia: Spinal

## 2016-10-29 MED ORDER — OXYCODONE-ACETAMINOPHEN 5-325 MG PO TABS: 1.0000 | ORAL_TABLET | ORAL | 0 refills | Status: AC | PRN

## 2016-10-29 NOTE — Progress Notes (Signed)
Subjective: Postpartum Day 2: Cesarean Delivery Patient reports that she may want to go home today  Objective: Vital signs in last 24 hours: Temp:  [97.8 F (36.6 C)-98.6 F (37 C)] 97.9 F (36.6 C) (09/17 0743) Pulse Rate:  [77-93] 77 (09/17 0743) Resp:  [20] 20 (09/17 0743) BP: (100-101)/(66-73) 100/66 (09/17 0743) SpO2:  [99 %-100 %] 100 % (09/17 0743)  Physical Exam:  General: A,A&O x3 Heart:S1S2, RRR, No M/R/G Lungs: CTA bilat, no W/R/R Lochia:Mod, no clots Uterine Fundus: Ff and U-1 Incision: Drsg removed and incision is C/D/I DVT Evaluation: Neg Homans   Recent Labs  10/27/16 0806  HGB 13.0  HCT 38.3    Assessment/Plan: Status post Cesarean section.  Pt wants to be discharged today  Sharee Pimple 10/29/2016, 9:47 AM

## 2016-10-29 NOTE — Discharge Instructions (Signed)

## 2016-10-29 NOTE — Progress Notes (Signed)
Discharge to car via auxillary; to car via [facility] with newborn

## 2016-10-29 NOTE — Progress Notes (Signed)
Teaching done per nurse and Hilbert Corrigan.  Verb u/o

## 2016-10-29 NOTE — Progress Notes (Signed)
Wound incision cleaning kit given for home use with instructions.

## 2016-10-30 MED FILL — Medication: Qty: 1 | Status: AC

## 2017-01-28 ENCOUNTER — Encounter (HOSPITAL_COMMUNITY): Payer: Self-pay

## 2017-10-01 LAB — HM HIV SCREENING LAB: HM HIV Screening: NEGATIVE

## 2017-12-14 IMAGING — US US MFM OB DETAIL+14 WK
1 series · 12 of 28 positions shown · non-contrast
Comparison: none

PATIENT INFO:

Name:       ERVISTA CEOLA              Visit Date: 07/19/2016 [DATE]
PERFORMED BY:
SERVICE(S) PROVIDED:
INDICATIONS:
24 weeks gestation of pregnancy
FETAL EVALUATION:
Num Of Fetuses:     1
Fetal Heart         144
Rate(bpm):
Presentation:       Variable
Placenta:           Posterior, No previa
P. Cord Insertion:  Normal
Amniotic Fluid
AFI FV:      Within normal limits
BIOMETRY:
BPD:        58  mm     G. Age:  23w 6d         21  %    CI:        69.17   %    70 - 86
FL/HC:       19.9  %    18.7 -
HC:      222.7  mm     G. Age:  24w 2d         26  %    HC/AC:       1.20       1.05 -
AC:       186   mm     G. Age:  23w 3d         15  %    FL/BPD:      76.6  %    71 - 87
FL:       44.4  mm     G. Age:  24w 5d         44  %    FL/AC:       23.9  %    20 - 24
HUM:      41.2  mm     G. Age:  25w 0d         54  %
Est. FW:     646   gm     1 lb 7 oz     26  %
GESTATIONAL AGE:
LMP:           24w 3d        Date:  01/30/16                 EDD:   11/05/16
U/S Today:     24w 1d                                        EDD:   11/07/16
Best:          24w 3d     Det. By:  LMP  (01/30/16)          EDD:   11/05/16
ANATOMY:
Cavum:                 CSP visualized         Aortic Arch:            Normal appearance
Ventricles:            Normal appearance      Ductal Arch:            Normal appearance
Choroid Plexus:        Within Normal Limits   Diaphragm:              Within Normal Limits
Cerebellum:            Within Normal Limits   Stomach:                Seen
Posterior Fossa:       Within Normal Limits   Abdomen:                Within Normal
Limits
Nuchal Fold:           Beyond 22 weeks        Abdominal Wall:         Within Normal Limits
gestation
Face:                  Orbits visualized      Cord Vessels:           Within Normal
Limits, 3 vessels
Lips:                  Normal appearance      Kidneys:                Normal appearance
Thoracic:              Within Normal Limits   Bladder:                Seen
Heart:                 4-Chamber view         Spine:                  Normal appearance
appears normal
RVOT:                  Normal appearance      Upper Extremities:      Visualized
LVOT:                  Normal appearance      Lower Extremities:      Visualized
CERVIX UTERUS ADNEXA:
Cervix
Length:            4.3  cm.
Right Ovary
Size(cm)       2.6  x   1.6    x  1.6       Vol(ml):

[Series 1: us mfm ob detail+14 wk · 0.23mm/px · 12 of 87 slices shown]
[im 4/87]
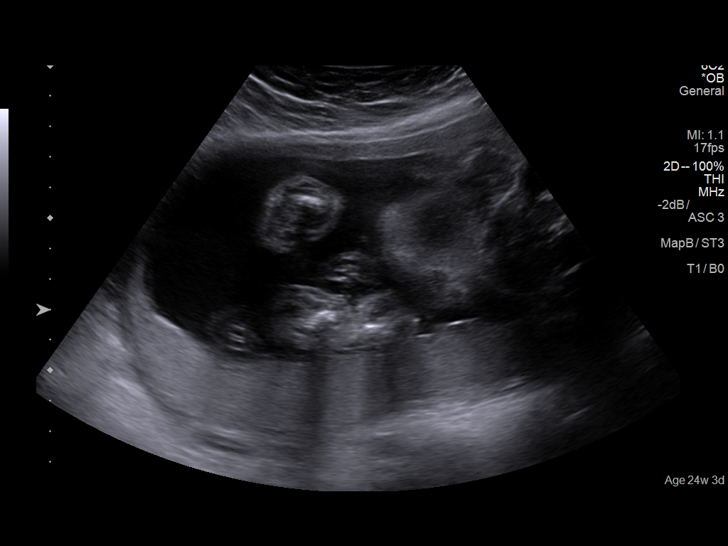
[im 10/87]
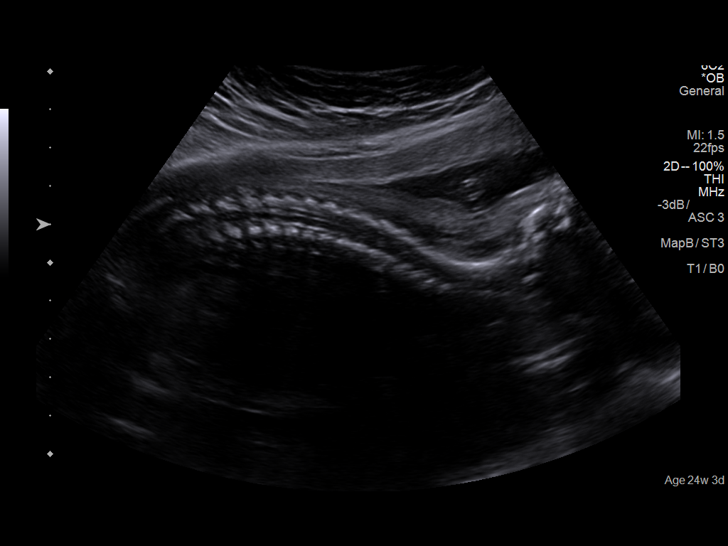
[im 16/87]
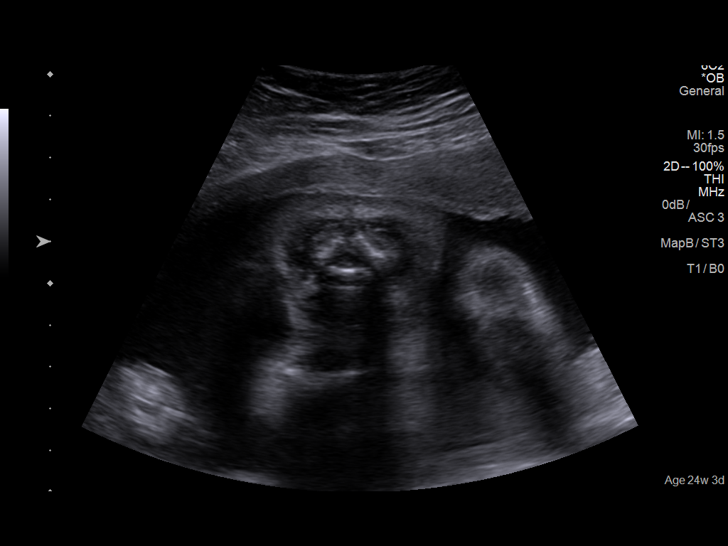
[im 26/87]
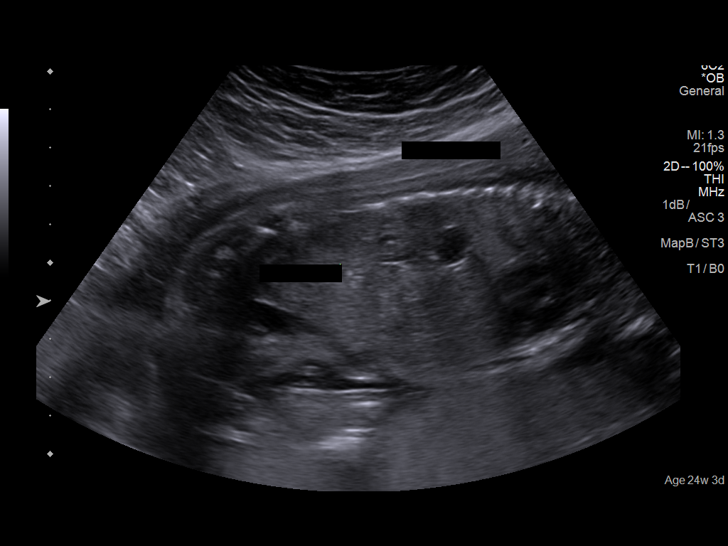
[im 32/87]
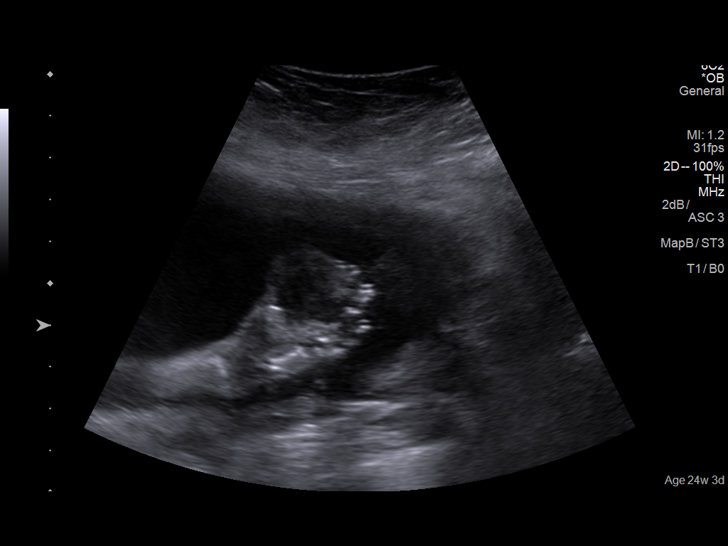
[im 39/87]
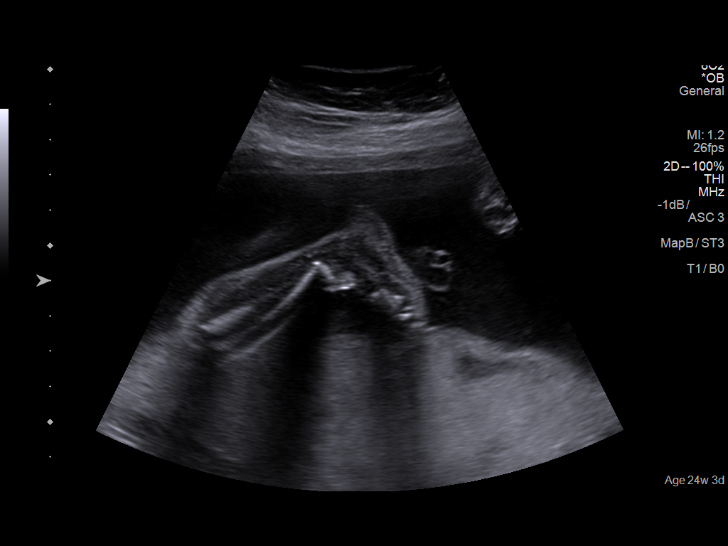
[im 48/87]
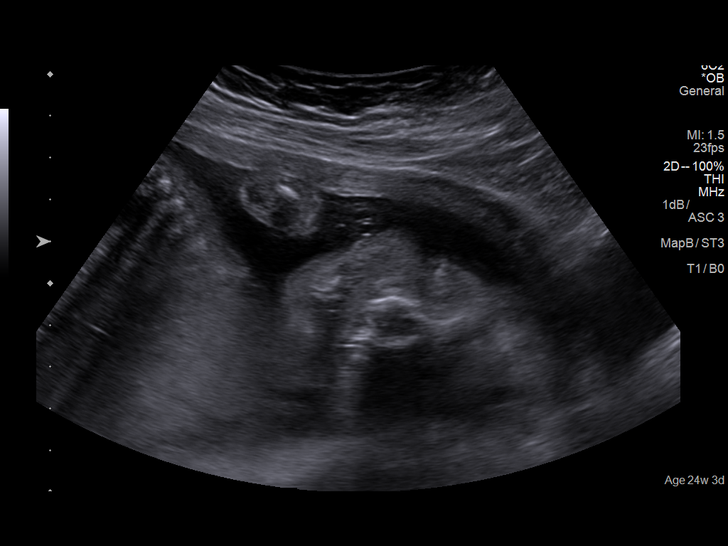
[im 55/87]
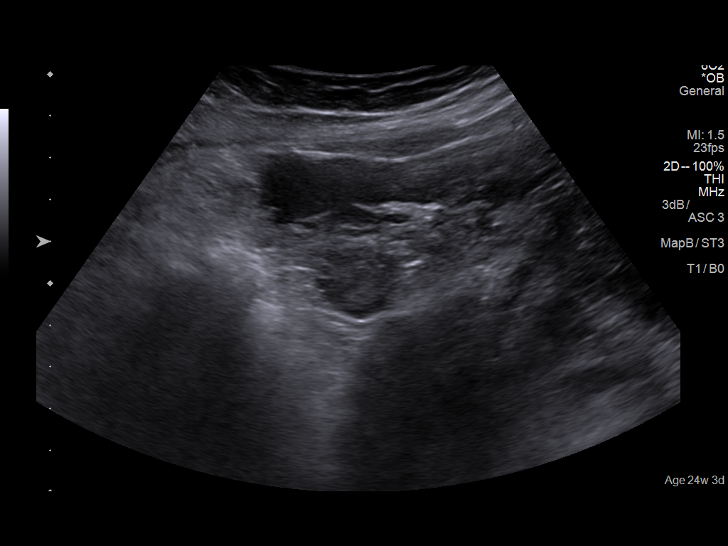
[im 61/87]
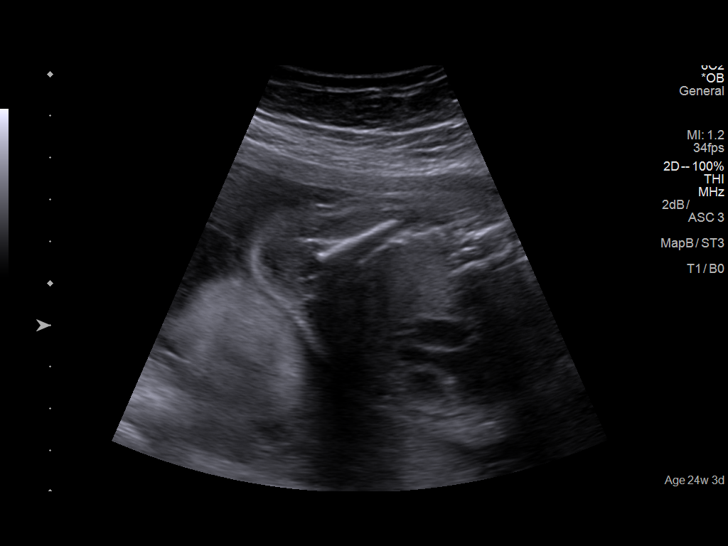
[im 71/87]
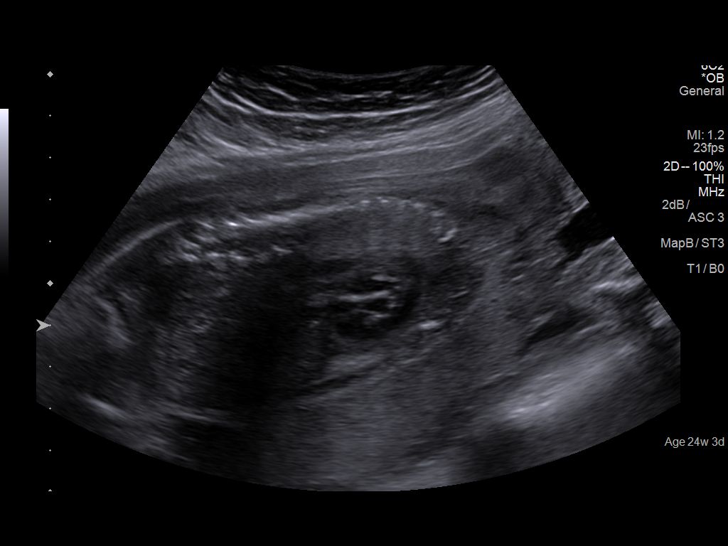
[im 77/87]
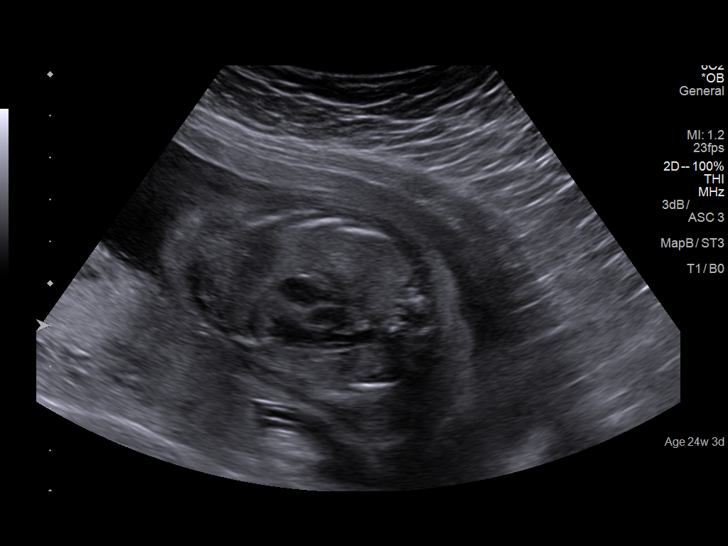
[im 83/87]
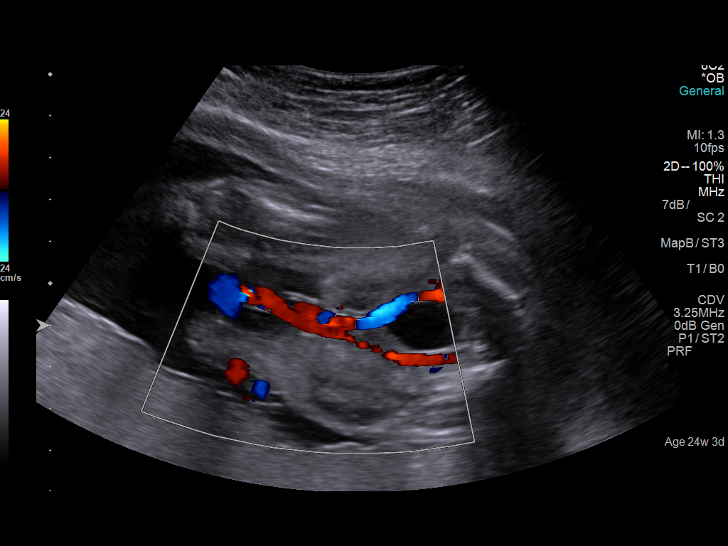

[12 of 28 positions shown; findings below may reference images not displayed]

IMPRESSION: Dear Dr.   FARISSE ,

Thank you for referring your patient to [facility] for a
detailed fetal anatomical survey due to concern for renal
dilation.

There is a singleton gestation with subjectively normal
amniotic fluid volume.

The fetal biometry correlates with established dating.

Detailed evaluation of the fetal anatomy was performed.

The fetal anatomical survey appears within normal limits
within the resolution of ultrasound as described above.  The
renal pelves both measured less than 4 mm and the length of
both kidneys was within normal limits based on gestational
age nomogram tables.

It must be noted that a normal ultrasound cannot rule out
aneuploidy.

Follow-up is advised as clinically indicated.

Thank you for allowing us to participate in your patient's care.
assistance.

## 2018-10-01 ENCOUNTER — Ambulatory Visit (LOCAL_COMMUNITY_HEALTH_CENTER): Payer: Self-pay

## 2018-10-01 ENCOUNTER — Other Ambulatory Visit: Payer: Self-pay

## 2018-10-01 VITALS — BP 111/79 | Ht 62.0 in | Wt 166.0 lb

## 2018-10-01 DIAGNOSIS — Z3009 Encounter for other general counseling and advice on contraception: Secondary | ICD-10-CM

## 2018-10-01 DIAGNOSIS — Z30011 Encounter for initial prescription of contraceptive pills: Secondary | ICD-10-CM

## 2018-10-01 MED ORDER — MULTI-VITAMIN/MINERALS PO TABS: 1.0000 | ORAL_TABLET | Freq: Every day | ORAL | 0 refills | Status: DC

## 2018-10-01 MED ORDER — NORGESTIM-ETH ESTRAD TRIPHASIC 0.18/0.215/0.25 MG-35 MCG PO TABS: 1.0000 | ORAL_TABLET | Freq: Every day | ORAL | 11 refills | Status: DC

## 2018-10-01 MED ORDER — NORGESTIM-ETH ESTRAD TRIPHASIC 0.18/0.215/0.25 MG-35 MCG PO TABS: 1.0000 | ORAL_TABLET | Freq: Every day | ORAL | 0 refills | Status: DC

## 2018-10-01 NOTE — Progress Notes (Signed)
Presents for ocps as last physical at [facility] 10/01/2017 (missed 2 days out of last pill pack).  Per client, last sex 09/22/2018. First day of normal LMP = 09/25/2018 with no sex since menses onset. Consult with Antoine Primas and following verbal order given: Tri Sprintec #3 packs to take one pill QD at same time QD. If ocps missed, client encouraged to use condoms with all sex until pill pack is completed. Shona Needles, RN

## 2018-10-02 ENCOUNTER — Other Ambulatory Visit: Payer: Self-pay

## 2018-10-02 DIAGNOSIS — Z20822 Contact with and (suspected) exposure to covid-19: Secondary | ICD-10-CM

## 2018-10-03 LAB — NOVEL CORONAVIRUS, NAA: SARS-CoV-2, NAA: DETECTED — AB

## 2018-12-17 ENCOUNTER — Ambulatory Visit: Payer: Self-pay

## 2018-12-19 ENCOUNTER — Encounter: Payer: Self-pay | Admitting: Advanced Practice Midwife

## 2018-12-19 ENCOUNTER — Ambulatory Visit (LOCAL_COMMUNITY_HEALTH_CENTER): Payer: Self-pay | Admitting: Advanced Practice Midwife

## 2018-12-19 ENCOUNTER — Other Ambulatory Visit: Payer: Self-pay

## 2018-12-19 VITALS — BP 121/78 | Ht 62.0 in | Wt 167.0 lb

## 2018-12-19 DIAGNOSIS — R87619 Unspecified abnormal cytological findings in specimens from cervix uteri: Secondary | ICD-10-CM | POA: Insufficient documentation

## 2018-12-19 DIAGNOSIS — Z3009 Encounter for other general counseling and advice on contraception: Secondary | ICD-10-CM

## 2018-12-19 DIAGNOSIS — E6609 Other obesity due to excess calories: Secondary | ICD-10-CM

## 2018-12-19 DIAGNOSIS — Z6830 Body mass index (BMI) 30.0-30.9, adult: Secondary | ICD-10-CM

## 2018-12-19 DIAGNOSIS — Z3041 Encounter for surveillance of contraceptive pills: Secondary | ICD-10-CM

## 2018-12-19 DIAGNOSIS — E669 Obesity, unspecified: Secondary | ICD-10-CM | POA: Insufficient documentation

## 2018-12-19 LAB — WET PREP FOR TRICH, YEAST, CLUE
Trichomonas Exam: NEGATIVE
Yeast Exam: NEGATIVE

## 2018-12-19 MED ORDER — NORGESTIM-ETH ESTRAD TRIPHASIC 0.18/0.215/0.25 MG-35 MCG PO TABS: 1.0000 | ORAL_TABLET | Freq: Every day | ORAL | 0 refills | Status: DC

## 2018-12-19 NOTE — Progress Notes (Signed)
   [place] problem visit  Candace Dominguez  Subjective:  Candace Dominguez is a 33 y.o. G4P2 SHF nonsmoker being seen today for more ocp's  Chief Complaint  Patient presents with  . Contraception    birth control pills    HPI Last pap 11/23/15 ASCUS -HPV.  Last physical 09/2017, Last sex 12/17/18 without condom; with current partner x 2 years.  CBE due 2020.  LMP 11/21/18  Does the patient have a current or past history of drug use? No   No components found for: HCV]   Health Maintenance Due  Topic Date Due  . TETANUS/TDAP  08/17/2002  . INFLUENZA VACCINE  09/13/2018  . PAP SMEAR-Modifier  11/18/2018    ROS  The following portions of the patient's history were reviewed and updated as appropriate: allergies, current medications, past family history, past medical history, past social history, past surgical history and problem list. Problem list updated.   See flowsheet for other program required questions.  Objective:   Vitals:   12/19/18 1550 12/19/18 1554  BP: 121/78 121/78  Weight: 167 lb (75.8 kg) 167 lb (75.8 kg)  Height: 5\' 2"  (1.575 m) 5\' 2"  (1.575 m)    Physical Exam Constitutional:      Appearance: Normal appearance. She is obese.  Eyes:     Conjunctiva/sclera: Conjunctivae normal.  Pulmonary:     Effort: Pulmonary effort is normal.     Breath sounds: Normal breath sounds.  Abdominal:     Palpations: Abdomen is soft.  Genitourinary:    General: Normal vulva.     Exam position: Lithotomy position.     Pubic Area: No rash or pubic lice.      Labia:        Right: No lesion.        Left: No lesion.      Vagina: Bleeding (small amt red menses blood; ph>4.5) present.     Cervix: Normal.     Rectum: Normal.  Skin:    General: Skin is warm and dry.  Neurological:     Mental Status: She is alert.       Assessment and Plan:  Candace Dominguez is a 33 y.o. female presenting to the [facility] for  a Women's Health problem visit  1. Abnormal cervical Papanicolaou smear, unspecified abnormal pap finding Repeat pap done today - IGP, Aptima HPV  2. Class 1 obesity due to excess calories without serious comorbidity with body mass index (BMI) of 30.0 to 30.9 in adult   3. Family planning Needs physical after COVID - WET PREP FOR [place], YEAST, [facility]  4. Encounter for surveillance of contraceptive pills Tri Sprintec #3 packs Forgets ocp's rarely.  Has 3 more pills left.     No follow-ups on file.  No future appointments.  Candace Dominguez, CNM

## 2018-12-19 NOTE — Progress Notes (Signed)
Wet mount reviewed and is negative today, so no treatment needed for wet mount per standing order. Pt received Tri Sprintec #3 packs today per Ola Spurr, CNM order. Counseled pt per provider orders and pt states understanding. Pt aware to return in 3 months for physical and birth control. Provider orders completed.Ronny Bacon, RN

## 2018-12-19 NOTE — Progress Notes (Signed)
Pt here for more birth control pills. Pt reports that she had her last period about 4 weeks ago. Pt has 3 birth control pills left but hasn't started her period yet this month. Pt denies any missed or late OCP's.Ronny Bacon, RN

## 2018-12-27 LAB — IGP, APTIMA HPV
HPV Aptima: NEGATIVE
PAP Smear Comment: 0

## 2019-03-09 ENCOUNTER — Other Ambulatory Visit: Payer: Self-pay | Admitting: Physician Assistant

## 2019-03-09 DIAGNOSIS — Z3041 Encounter for surveillance of contraceptive pills: Secondary | ICD-10-CM

## 2019-03-09 MED ORDER — NORGESTIM-ETH ESTRAD TRIPHASIC 0.18/0.215/0.25 MG-35 MCG PO TABS: 1.0000 | ORAL_TABLET | Freq: Every day | ORAL | 0 refills | Status: DC

## 2019-03-09 NOTE — Progress Notes (Signed)
Per chart, last RP was 09/2017, CBE due 2020 and pap due 2023.   Provided that patient desires to continue with OCs, has not started smoking and BP is within normal range, OK for patient to have Tri Sprintec #13.  If we do not have supply, give supply based on exp date and have patient RTC to pick up rest of supply.

## 2019-03-10 ENCOUNTER — Other Ambulatory Visit: Payer: Self-pay

## 2019-03-10 ENCOUNTER — Ambulatory Visit (LOCAL_COMMUNITY_HEALTH_CENTER): Payer: Self-pay

## 2019-03-10 VITALS — BP 114/79 | Ht 62.0 in | Wt 166.5 lb

## 2019-03-10 DIAGNOSIS — Z3041 Encounter for surveillance of contraceptive pills: Secondary | ICD-10-CM

## 2019-03-10 DIAGNOSIS — Z3009 Encounter for other general counseling and advice on contraception: Secondary | ICD-10-CM

## 2019-03-10 MED ORDER — NORGESTIM-ETH ESTRAD TRIPHASIC 0.18/0.215/0.25 MG-35 MCG PO TABS: 1.0000 | ORAL_TABLET | Freq: Every day | ORAL | 0 refills | Status: DC

## 2019-03-10 NOTE — Progress Notes (Signed)
Pt to RN clinic for OCP supply, no problems with current OCP supply. Pt denies using tobacco products or smoking, counseled not to start. See Sadie Haber, PA order dated 03/09/19 to dispense #13 packs of Tri Sprintec as long as pt has not started smoking and BP okay; dispensed #5 packs Tri Sprintec today due to expire date/qty available.

## 2019-07-27 ENCOUNTER — Ambulatory Visit (LOCAL_COMMUNITY_HEALTH_CENTER): Payer: Self-pay

## 2019-07-27 ENCOUNTER — Other Ambulatory Visit: Payer: Self-pay

## 2019-07-27 VITALS — BP 116/74 | Ht 62.0 in | Wt 171.5 lb

## 2019-07-27 DIAGNOSIS — Z30011 Encounter for initial prescription of contraceptive pills: Secondary | ICD-10-CM

## 2019-07-27 DIAGNOSIS — Z3009 Encounter for other general counseling and advice on contraception: Secondary | ICD-10-CM

## 2019-07-27 MED ORDER — MULTI-VITAMIN/MINERALS PO TABS: 1.0000 | ORAL_TABLET | Freq: Every day | ORAL | 0 refills | Status: AC

## 2019-07-27 MED ORDER — NORGESTIM-ETH ESTRAD TRIPHASIC 0.18/0.215/0.25 MG-35 MCG PO TABS: 1.0000 | ORAL_TABLET | Freq: Every day | ORAL | 0 refills | Status: DC

## 2019-07-27 NOTE — Progress Notes (Addendum)
Client presents for MR and is on week 4, day 2 of last pack of ocps. 8 packs Tri-Sprintec dispensed to completed order by Sadie Haber PA on 03/09/2019. Jossie Ng, RN  Folic acid counseling completed and MVI dispensed. Salli Real, [facility] interpreter, interpreted during last half of visit. Jossie Ng, RN  [phone] and [id number] charge capture codes deleted as client received ocps at 07/27/2019 appt and not Depo injection. Jossie Ng, RN

## 2023-02-28 ENCOUNTER — Encounter: Payer: Self-pay | Admitting: Advanced Practice Midwife

## 2023-02-28 ENCOUNTER — Ambulatory Visit: Payer: Self-pay | Admitting: Advanced Practice Midwife

## 2023-02-28 VITALS — BP 116/72 | HR 73 | Wt 167.4 lb

## 2023-02-28 DIAGNOSIS — N760 Acute vaginitis: Secondary | ICD-10-CM

## 2023-02-28 DIAGNOSIS — B9689 Other specified bacterial agents as the cause of diseases classified elsewhere: Secondary | ICD-10-CM

## 2023-02-28 DIAGNOSIS — Z3202 Encounter for pregnancy test, result negative: Secondary | ICD-10-CM

## 2023-02-28 DIAGNOSIS — Z3009 Encounter for other general counseling and advice on contraception: Secondary | ICD-10-CM

## 2023-02-28 DIAGNOSIS — E119 Type 2 diabetes mellitus without complications: Secondary | ICD-10-CM | POA: Insufficient documentation

## 2023-02-28 DIAGNOSIS — R87619 Unspecified abnormal cytological findings in specimens from cervix uteri: Secondary | ICD-10-CM

## 2023-02-28 DIAGNOSIS — E1169 Type 2 diabetes mellitus with other specified complication: Secondary | ICD-10-CM

## 2023-02-28 DIAGNOSIS — Z3041 Encounter for surveillance of contraceptive pills: Secondary | ICD-10-CM

## 2023-02-28 LAB — HEMOGLOBIN, FINGERSTICK: Hemoglobin: 12.3 g/dL (ref 11.1–15.9)

## 2023-02-28 LAB — WET PREP FOR TRICH, YEAST, CLUE
Trichomonas Exam: NEGATIVE
Yeast Exam: NEGATIVE

## 2023-02-28 LAB — HM HEPATITIS C SCREENING LAB: HM Hepatitis Screen: NEGATIVE

## 2023-02-28 LAB — HM HIV SCREENING LAB: HM HIV Screening: NEGATIVE

## 2023-02-28 LAB — PREGNANCY, URINE: Preg Test, Ur: NEGATIVE

## 2023-02-28 MED ORDER — METRONIDAZOLE 500 MG PO TABS: 500.0000 mg | ORAL_TABLET | Freq: Two times a day (BID) | ORAL | Status: AC

## 2023-02-28 MED ORDER — NORGESTIM-ETH ESTRAD TRIPHASIC 0.18/0.215/0.25 MG-35 MCG PO TABS: 1.0000 | ORAL_TABLET | Freq: Every day | ORAL | 13 refills | Status: AC

## 2023-02-28 NOTE — Progress Notes (Signed)
Patient is here for [facility] visit. FP packet given to patient and contents reviewed. Wet prep results reviewed with pt. The patient was dispensed metronidazole 500mg  2x/day for 7 days and BCP- Sprintec today. I provided counseling today regarding the medication. We discussed the medication, the side effects and when to call clinic. Patient given the opportunity to ask questions for any clarification. Condoms declined. Sonda Primes, RN.

## 2023-02-28 NOTE — Progress Notes (Signed)
[employer] [facility] DEPARTMENT [facility] [address], [address] [place] [address] Main phone: [phone]  Family Planning Visit - Initial Visit  Subjective:  Candace Dominguez is a 38 y.o. SHF nonsmoker [id number] (17, 6)  being seen today for an initial annual visit and to discuss reproductive life planning.  The patient is currently using Female Condom for pregnancy prevention. Patient reports   does not want a pregnancy in the next year.    Patient reports they are looking for a method that provides High efficacy at preventing pregnancy  Patient has the following medical conditions has Postoperative state; Abnormal Pap smear of cervix 11/23/15 ASCUS -HPV; and Obesity BMI=30.5 on their problem list.  Chief Complaint  Patient presents with   Annual Exam    Pt is here PE and BC    HPI Patient reports here for physical and ocp initiation. Last PE 10/01/17. Last pap 12/19/2018 neg HPV neg but prior pap was 11/23/15 ASCUS HPV-. LMP 02/20/23. Last sex 02/15/23 with condom; with current partner x 5 months; 1 partner in last 3 months. States was dx'd with DM and put on po meds but 1 year ago was "prediabetic" so MD did not continue her po meds. Wants HgbA1C today. Last dental exam 2 years ago. Highest grade completed 10th. Last ETOH 02/12/23 (1 glass wine). Working 40 hrs/wk and living with her 2 kids.  Patient denies cigs, vaping, cigars, MJ  Review of Systems  Eyes:  Positive for blurred vision ("always" and has never had an eye exam; encouraged eye exam).    Diabetes screening This patient is 38 y.o. with a BMI of Body mass index is 30.62 kg/m.Marland Kitchen  Is patient eligible for diabetes screening (age >35 and BMI >25)?  yes  Was Hgb A1c ordered? yes  STI screening Patient reports 1 of partners in last year.  Does this patient desire STI screening?  Yes  Hepatitis C screening Has patient been screened once for HCV in the past?  Yes  No results found for:  "HCVAB"  Does the patient meet criteria for HCV testing? No  (If yes-- Screen for HCV through [facility]) Criteria:  Since the last HCV result, does the patient have any of the following? - Current drug use - Have a partner with drug use - Has been incarcerated  Hepatitis B screening Does the patient meet criteria for HBV testing? No Criteria:  -Household, sexual or needle sharing contact with HBV -History of drug use -HIV positive -Those with known Hep C  Cervical Cancer Screening  No results found for: "DIAGPAP", "HPVHIGH", "ADEQPAP" Lab Results  Component Value Date   SPECADGYN Comment 12/19/2018   Result Date Procedure Results Follow-ups  12/19/2018 IGP, Aptima HPV DIAGNOSIS:: Comment Specimen adequacy:: Comment Clinician Provided ICD10: Comment Performed by:: Comment PAP Smear Comment: . Note:: Comment Test Methodology: Comment HPV Aptima: Negative   11/18/2015 HM PAP SMEAR HM Pap smear: ASCUS, HPV negative     Health Maintenance Due  Topic Date Due   Hepatitis C Screening  Never done   DTaP/Tdap/Td (1 - Tdap) Never done   INFLUENZA VACCINE  Never done   COVID-19 Vaccine (1 - 2024-25 season) Never done    The following portions of the patient's history were reviewed and updated as appropriate: allergies, current medications, past family history, past medical history, past social history, past surgical history and problem list. Problem list updated.  See flowsheet for other program required questions.  Objective:  Vitals:   02/28/23 0840  BP: 116/72  Pulse: 73  Weight: 167 lb 6.4 oz (75.9 kg)    Physical Exam Constitutional:      Appearance: Normal appearance. She is normal weight.  HENT:     Head: Normocephalic and atraumatic.     Mouth/Throat:     Mouth: Mucous membranes are moist.     Comments: Last dental exam 2 years ago Eyes:     Conjunctiva/sclera: Conjunctivae normal.  Neck:     Thyroid: No thyroid mass, thyromegaly or thyroid  tenderness.  Cardiovascular:     Rate and Rhythm: Normal rate and regular rhythm.  Pulmonary:     Effort: Pulmonary effort is normal.     Breath sounds: Normal breath sounds.  Chest:  Breasts:    Right: Normal.     Left: Normal.  Abdominal:     Palpations: Abdomen is soft.     Comments: Soft without masses or tenderness, fair tone  Genitourinary:    General: Normal vulva.     Exam position: Lithotomy position.     Pubic Area: No pubic lice.      Vagina: Vaginal discharge (grey malodorous creamy leukorrhea, ph>4.5) present.     Cervix: Normal.     Uterus: Normal.      Adnexa: Right adnexa normal and left adnexa normal.     Rectum: Normal.     Comments: No evidence of nits Pap done Candace Dominguez chaperone Musculoskeletal:        General: Normal range of motion.     Cervical back: Normal range of motion and neck supple.  Skin:    General: Skin is warm and dry.  Neurological:     Mental Status: She is alert.  Psychiatric:        Mood and Affect: Mood normal.     Assessment and Plan:  Candace Dominguez is a 38 y.o. female presenting to the [facility] for an initial annual wellness/contraceptive visit  Contraception counseling: Reviewed options based on patient desire and reproductive life plan. Patient is interested in Oral Contraceptive. This was provided to the patient today. if not why not clearly documented  Risks, benefits, and typical effectiveness rates were reviewed.  Questions were answered.  Written information was also given to the patient to review.    The patient will follow up in  1 years for surveillance.  The patient was told to call with any further questions, or with any concerns about this method of contraception.  Emphasized use of condoms 100% of the time for STI prevention.  Educated on ECP and assessed for need of ECP. Patient reported used condom 02/15/23.  Reviewed options and patient desired No method of ECP, declined all    1.  Family planning (Primary) Please treat for BV per standing orders Immunization nurse consult Treat wet mount per standing orders Please give pt [facility] brochure to schedule mammogram  - IGP, Aptima HPV - WET PREP FOR TRICH, YEAST, CLUE - Pregnancy, urine - Hemoglobin, venipuncture - Chlamydia/Gonorrhea [place] - Syphilis Serology, [place] [facility] - HIV/HCV [place] - Hgb A1c w/o eAG  2. Encounter for surveillance of contraceptive pills If PT neg today may give Tri Sprintec #13 to begin today or tomorrow Pt counseled abstinance next 7 days  - IGP, Aptima HPV  3. Abnormal cervical Papanicolaou smear, unspecified abnormal pap finding Repeat pap done today   No follow-ups on file.  No future appointments.  Lanora Manis  A Danyka Merlin, CNM

## 2023-02-28 NOTE — Addendum Note (Signed)
Addended by: Sonda Primes on: 02/28/2023 11:23 AM   Modules accepted: Orders

## 2023-03-01 ENCOUNTER — Telehealth: Payer: Self-pay

## 2023-03-01 LAB — HGB A1C W/O EAG: Hgb A1c MFr Bld: 6.4 % — ABNORMAL HIGH (ref 4.8–5.6)

## 2023-03-01 NOTE — Telephone Encounter (Signed)
Pt has been notified about Hgb A1C results of 6.4 and been encouraged to make an appointment with her PCP. Sonda Primes, RN.

## 2023-03-07 LAB — IGP, APTIMA HPV
HPV Aptima: NEGATIVE
PAP Smear Comment: 0

## 2023-06-05 ENCOUNTER — Ambulatory Visit: Payer: Self-pay

## 2023-06-05 VITALS — BP 110/62 | Ht 62.0 in | Wt 166.5 lb

## 2023-06-05 DIAGNOSIS — Z3041 Encounter for surveillance of contraceptive pills: Secondary | ICD-10-CM

## 2023-06-05 DIAGNOSIS — Z3009 Encounter for other general counseling and advice on contraception: Secondary | ICD-10-CM

## 2023-06-05 MED ORDER — NORGESTIM-ETH ESTRAD TRIPHASIC 0.18/0.215/0.25 MG-35 MCG PO TABS: 1.0000 | ORAL_TABLET | Freq: Every day | ORAL | Status: AC

## 2023-06-05 NOTE — Progress Notes (Signed)
 In nurse clinic for OCP refill. LMP 05/21/23. Last sex approx. 1 week ago. Patient reports receiving 4 packs during 02/28/23 visit. Patient denies losing or missing pills. 9 packs left in current order.   The patient was dispensed 3 packs of Tri Sprintec today (due to expiration date) per order by Azzie Bollman, CNM dated 02/28/23. I provided counseling today regarding the medication. We discussed the medication, the side effects and when to call clinic. Patient given the opportunity to ask questions. Questions answered.   Interpreter, Kem Patten, helped during this visit.   Clare Critchley, RN

## 2023-09-02 ENCOUNTER — Ambulatory Visit: Payer: Self-pay

## 2023-09-02 VITALS — BP 122/74 | Ht 62.0 in | Wt 166.5 lb

## 2023-09-02 DIAGNOSIS — Z30011 Encounter for initial prescription of contraceptive pills: Secondary | ICD-10-CM

## 2023-09-02 DIAGNOSIS — Z3041 Encounter for surveillance of contraceptive pills: Secondary | ICD-10-CM

## 2023-09-02 DIAGNOSIS — Z3009 Encounter for other general counseling and advice on contraception: Secondary | ICD-10-CM

## 2023-09-02 MED ORDER — NORGESTIM-ETH ESTRAD TRIPHASIC 0.18/0.215/0.25 MG-35 MCG PO TABS: 1.0000 | ORAL_TABLET | Freq: Every day | ORAL | Status: AC

## 2023-09-02 NOTE — Progress Notes (Signed)
 In nurse clinic for ocp refill(TriSprintec)Tri-Milli. Takes pills at same time every day.Denies missing any pills.Has 2wks left of last pack.  TriSprintec #6 packs have been dispensed since order by CHARLENA Hillier, CNM on 02/28/23. Due for #6 packs today.   The patient was dispensed (Tri-Sprintec)Tri-Milli #6packs today. I provided counseling today regarding the medication. We discussed the medication, the side effects and when to call clinic. Patient given the opportunity to ask questions. Questions answered.
